# Patient Record
Sex: Female | Born: 1965 | Race: Black or African American | Hispanic: No | Marital: Single | Smoking: Never smoker
Health system: Southern US, Community
[De-identification: ages and names within clinical notes are randomized; demographics above are authoritative.]

## PROBLEM LIST (undated history)

## (undated) DIAGNOSIS — R002 Palpitations: Secondary | ICD-10-CM

## (undated) DIAGNOSIS — G4730 Sleep apnea, unspecified: Secondary | ICD-10-CM

## (undated) DIAGNOSIS — D649 Anemia, unspecified: Secondary | ICD-10-CM

## (undated) DIAGNOSIS — I10 Essential (primary) hypertension: Secondary | ICD-10-CM

## (undated) DIAGNOSIS — C801 Malignant (primary) neoplasm, unspecified: Secondary | ICD-10-CM

## (undated) DIAGNOSIS — Z889 Allergy status to unspecified drugs, medicaments and biological substances status: Secondary | ICD-10-CM

## (undated) DIAGNOSIS — E213 Hyperparathyroidism, unspecified: Secondary | ICD-10-CM

## (undated) HISTORY — PX: TUBAL LIGATION: SHX77

## (undated) HISTORY — PX: OTHER SURGICAL HISTORY: SHX169

## (undated) HISTORY — PX: GYNECOLOGIC CRYOSURGERY: SHX857

## (undated) HISTORY — DX: Essential (primary) hypertension: I10

---

## 2016-01-09 ENCOUNTER — Encounter: Payer: Self-pay | Admitting: Cardiovascular Disease

## 2016-01-09 ENCOUNTER — Ambulatory Visit (INDEPENDENT_AMBULATORY_CARE_PROVIDER_SITE_OTHER): Payer: PRIVATE HEALTH INSURANCE | Admitting: Cardiovascular Disease

## 2016-01-09 VITALS — BP 152/100 | HR 61 | Ht 63.0 in | Wt 285.0 lb

## 2016-01-09 DIAGNOSIS — R002 Palpitations: Secondary | ICD-10-CM | POA: Diagnosis not present

## 2016-01-09 DIAGNOSIS — G4733 Obstructive sleep apnea (adult) (pediatric): Secondary | ICD-10-CM | POA: Diagnosis not present

## 2016-01-09 DIAGNOSIS — I10 Essential (primary) hypertension: Secondary | ICD-10-CM | POA: Diagnosis not present

## 2016-01-09 MED ORDER — LISINOPRIL 5 MG PO TABS: 5.0000 mg | ORAL_TABLET | Freq: Every day | ORAL | Status: DC

## 2016-01-09 NOTE — Patient Instructions (Addendum)
   Begin Lisinopril 5mg  daily - new sent to [facility] today. Continue all other medications.   Follow up in  6 weeks    [address] [place], [place]  [address] [phone]

## 2016-01-09 NOTE — Progress Notes (Signed)
Patient ID: Kim Bryant, female   DOB: [birth year], 51 y.o.   MRN: [id number]       CARDIOLOGY CONSULT NOTE  Patient ID: Kim Bryant MRN: [id number] DOB/AGE: 12-30-1965 50 y.o.  Admit date: (Not on file) Primary Physician: Frederic Jericho, NP Referring Physician: Carlis Abbott NP  Reason for Consultation: palpitations  HPI: The patient is a 50 year old woman with a history of Graves' disease status post iodine 131 ablation and hyperparathyroidism as well as vitamin D deficiency. She has previously been evaluated by a cardiologist with [facility]. She reportedly has a history of a nuclear stress test which was nonischemic and calculated EF 43% (no report available). Echocardiogram performed in 2016 was a technically difficult study but did demonstrate normal left ventricular systolic and diastolic function, EF 0000000. She has sleep apnea. She was reportedly being scheduled for a cardiac MRI in order to more accurately assess LVEF, scar, and to evaluate for ARVD, but insurance would not cover this.  I have personally reviewed all labs and studies pertaining to this consultation. Labs on 08/18/15 showed BUN 11, creatinine 0.68, potassium 4.9, magnesium 2.2, total cholesterol 160, triglycerides 72, HDL 45, LDL 101, with normal hemoglobin of 12.7 on 06/27/15 and TSH 0.5.  She has had problems with her insurance company and has been unable to have her CPAP settings adjusted. She is being scheduled to see an endocrinologist, Dr. Dorris Fetch, in [place]. She was told she may have a thyroid tumor and may require surgery. She said her blood pressure has been elevated over the past several months. She has palpitations when washing the dishes, while sitting down, and particularly when lying down at night after working the night shift. She is a Marine scientist at the [employer]. She has associated dizziness but denies syncope. She denies chest pain and shortness of breath. I do not have a copy of the Holter monitor  nor an ECG. There is no family history of sudden cardiac death.   No Known Allergies  Current Outpatient Prescriptions  Medication Sig Dispense Refill  . ferrous sulfate 325 (65 FE) MG tablet Take 325 mg by mouth daily with breakfast.     . furosemide (LASIX) 20 MG tablet Take 10 mg by mouth daily.     Marland Kitchen levothyroxine (SYNTHROID, LEVOTHROID) 175 MCG tablet Take 175 mcg by mouth daily before breakfast.     . metoprolol succinate (TOPROL-XL) 25 MG 24 hr tablet Take 25 mg by mouth daily.     . Vitamin D, Ergocalciferol, (DRISDOL) 50000 units CAPS capsule Take 50,000 Units by mouth 2 (two) times a week.     No current facility-administered medications for this visit.    Past Medical History  Diagnosis Date  . Hypertension     No past surgical history on file.  Social History   Social History  . Marital Status: Single    Spouse Name: N/A  . Number of Children: N/A  . Years of Education: N/A   Occupational History  . Not on file.   Social History Main Topics  . Smoking status: Never Smoker   . Smokeless tobacco: Never Used  . Alcohol Use: Not on file  . Drug Use: Not on file  . Sexual Activity: Not on file   Other Topics Concern  . Not on file   Social History Narrative  . No narrative on file     No family history of premature CAD in 1st degree relatives.  Prior to Admission medications   Medication  Sig Start Date End Date Taking? Authorizing Provider  ferrous sulfate 325 (65 FE) MG tablet Take 325 mg by mouth daily with breakfast.    Yes Historical Provider, MD  furosemide (LASIX) 20 MG tablet Take 10 mg by mouth daily.    Yes Historical Provider, MD  levothyroxine (SYNTHROID, LEVOTHROID) 175 MCG tablet Take 175 mcg by mouth daily before breakfast.    Yes Historical Provider, MD  metoprolol succinate (TOPROL-XL) 25 MG 24 hr tablet Take 25 mg by mouth daily.    Yes Historical Provider, MD  Vitamin D, Ergocalciferol, (DRISDOL) 50000 units CAPS capsule Take 50,000  Units by mouth 2 (two) times a week.   Yes Historical Provider, MD     Review of systems complete and found to be negative unless listed above in HPI     Physical exam Blood pressure 152/100, pulse 61, height 5\' 3"  (1.6 m), weight 285 lb (129.275 kg), SpO2 96 %. General: NAD Neck: No JVD.  Lungs: Clear to auscultation bilaterally with normal respiratory effort. CV: Nondisplaced PMI. Regular rate and rhythm, normal S1/S2, no S3/S4, no murmur.  No peripheral edema.  No carotid bruit.   Abdomen: Soft, obese. Skin: Intact without lesions or rashes.  Neurologic: Alert and oriented x 3.  Psych: Normal affect. Extremities: No clubbing or cyanosis.  HEENT: Normal.   ECG: Most recent ECG reviewed.  Labs:  No results found for: WBC, HGB, HCT, MCV, PLT No results for input(s): NA, K, CL, CO2, BUN, CREATININE, CALCIUM, PROT, BILITOT, ALKPHOS, ALT, AST, GLUCOSE in the last 168 hours.  Invalid input(s): LABALBU No results found for: CKTOTAL, CKMB, CKMBINDEX, TROPONINI No results found for: CHOL No results found for: HDL No results found for: LDLCALC No results found for: TRIG No results found for: CHOLHDL No results found for: LDLDIRECT       Studies: No results found.  ASSESSMENT AND PLAN:  1. Palpitations: May be driven by underlying thyroid disease. Scheduled to see an endocrinologist. Will obtain copy of ECG, stress test, and Holter monitor. Given resting HR in 60's, unable to increase metoprolol dose. Will aim to control BP as well.  2. Essential HTN: Elevated. I have encouraged her to try taking CPAP to [facility] in [place] to respiratory therapist who specializes in sleep medicine. Will star lisinopril 5 mg daily.  3. Sleep apnea: This is an underlying risk factor for arrhythmias, MI, and CVA. I have encouraged her to try taking CPAP to [facility] in [place] to respiratory therapist who specializes in sleep medicine.  Dispo: fu 6 weeks.   Signed: Kate Sable,  M.D., F.A.C.C.  01/09/2016, 8:43 AM

## 2016-01-17 ENCOUNTER — Ambulatory Visit: Payer: Self-pay | Admitting: Cardiovascular Disease

## 2016-01-23 ENCOUNTER — Encounter: Payer: Self-pay | Admitting: *Deleted

## 2016-01-25 ENCOUNTER — Ambulatory Visit (INDEPENDENT_AMBULATORY_CARE_PROVIDER_SITE_OTHER): Payer: PRIVATE HEALTH INSURANCE | Admitting: "Endocrinology

## 2016-01-25 ENCOUNTER — Encounter: Payer: Self-pay | Admitting: "Endocrinology

## 2016-01-25 VITALS — BP 149/89 | HR 67 | Ht 63.0 in | Wt 281.0 lb

## 2016-01-25 DIAGNOSIS — E213 Hyperparathyroidism, unspecified: Secondary | ICD-10-CM | POA: Diagnosis not present

## 2016-01-25 DIAGNOSIS — E038 Other specified hypothyroidism: Secondary | ICD-10-CM

## 2016-01-25 NOTE — Progress Notes (Signed)
Subjective:    Patient ID: Kim Bryant, female    DOB: 1965/10/12, PCP Frederic Jericho, NP   Past Medical History  Diagnosis Date  . Hypertension    History reviewed. No pertinent past surgical history. Social History   Social History  . Marital Status: Single    Spouse Name: N/A  . Number of Children: N/A  . Years of Education: N/A   Social History Main Topics  . Smoking status: Never Smoker   . Smokeless tobacco: Never Used  . Alcohol Use: None  . Drug Use: None  . Sexual Activity: Not Asked   Other Topics Concern  . None   Social History Narrative   Outpatient Encounter Prescriptions as of 01/25/2016  Medication Sig  . Cholecalciferol (VITAMIN D3) 2000 units TABS Take by mouth daily.  . ferrous sulfate 325 (65 FE) MG tablet Take 325 mg by mouth daily with breakfast.   . furosemide (LASIX) 20 MG tablet Take 10 mg by mouth daily.   Marland Kitchen levothyroxine (SYNTHROID, LEVOTHROID) 175 MCG tablet Take 175 mcg by mouth daily before breakfast.   . lisinopril (PRINIVIL,ZESTRIL) 5 MG tablet Take 1 tablet (5 mg total) by mouth daily.  . metoprolol succinate (TOPROL-XL) 25 MG 24 hr tablet Take 25 mg by mouth daily.   . Vitamin D, Ergocalciferol, (DRISDOL) 50000 units CAPS capsule Take 50,000 Units by mouth 2 (two) times a week.   No facility-administered encounter medications on file as of 01/25/2016.   ALLERGIES: No Known Allergies VACCINATION STATUS:  There is no immunization history on file for this patient.  HPI  50 year old female patient with medical history as above. 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 This consult was requested by Dr. Mady Gemma. She is on levothyroxine 175 g by mouth every morning. She has been following at endocrine care clinic  in [place], [place]. - She was seen by Dr. Vicente Males  in there. She did have last thyroid function test in March,  not available for review today. She reports compliance with her medications.    One other  problem she had was recent discovery of hypercalcemia associated with elevated PTH levels- suspicious for hyperparathyroidism. Per her report, she underwent what appears to be parathyroid scan and was considered for surgical treatment for hyperparathyroidism. However, her insurance did not cover this care in [place], hence referral to me. Her current referral packet does not include any of her workup for hyperparathyroidism. -However, she does not recall if she had 24 hour urine calcium determined. -She denies dysphagia, shortness of breath, nor voice change. - She denies heat/cold intolerance. She has progressive weight gain. -She denies history of nephrolithiasis, osteoporosis, nor seizure disorders. -Her intake of dairy products is that of average. She denies family history of parathyroid, pituitary, and adrenal problem/dysfunction.  Review of Systems Constitutional: weight gain, no fatigue, no subjective hyperthermia/hypothermia Eyes: no blurry vision, no xerophthalmia ENT: no sore throat, no nodules palpated in throat, no dysphagia/odynophagia, no hoarseness Cardiovascular: no CP/SOB/palpitations/leg swelling Respiratory: no cough/SOB Gastrointestinal: no N/V/D/C Musculoskeletal: no muscle/joint aches Skin: no rashes Neurological: no tremors/numbness/tingling/dizziness Psychiatric: no depression/anxiety Objective:    BP 149/89 mmHg  Pulse 67  Ht 5\' 3"  (1.6 m)  Wt 281 lb (127.461 kg)  BMI 49.79 kg/m2  SpO2 97%  Wt Readings from Last 3 Encounters:  01/25/16 281 lb (127.461 kg)  01/09/16 285 lb (129.275 kg)  Physical Exam  Constitutional: overweight, in NAD Eyes: PERRLA, EOMI, no exophthalmos ENT: moist mucous membranes, no thyromegaly, no cervical lymphadenopathy Cardiovascular: RRR, No MRG Respiratory: CTA B Gastrointestinal: abdomen soft, NT, ND, BS+ Musculoskeletal: no  deformities, strength intact in all 4 Skin: moist, warm, no rashes Neurological: no tremor with outstretched hands, DTR normal in all 4   Review of her records shows that in October 2016 her intact PTH was elevated at 109 [normal 15-65 ) associated with elevated calcium of 11.2.  TSH was 0.505 associated with free T4 of 1.05  Assessment & Plan:   1. RAI induced hypothyroidism - She has well settled diagnosis of RAI induced hypothyroidism given for hyperparathyroidism 25 years ago. -I don't have recent labs to review, I advised her to continue levothyroxine 175 g by mouth every morning.  - We discussed about correct intake of levothyroxine, at fasting, with water, separated by at least 30 minutes from breakfast, and separated by more than 4 hours from calcium, iron, multivitamins, acid reflux medications (PPIs). -Patient is made aware of the fact that thyroid hormone replacement is needed for life, dose to be adjusted by periodic monitoring of thyroid function tests. -I will request for a copy of her last workup/thyroid function test from her former endocrine allergy care clinic in [place], [place]. -Will make adjustment in her levothyroxine dose if necessary.  2. Hyperparathyroidism, unspecified ([facility]) - I need to review her completed hyperparathyroidism workup before we decide on surgical care on her. If she is confirmed to have primary hyperparathyroidism ,she is a surgical candidate. - One very important workup left to do is 24-hour urine calcium determination. This is important to rule out earlier differential diagnosis of familial hypocalciuric hypercalcemia which would not  need any treatment. -If her scan findings agree with 24-hour urine calcium levels, she will be referred to a local endocrine surgeon in [place], [place].   - I advised patient to maintain close follow up with Frederic Jericho, NP for primary care needs. Follow up plan: Return in about 2 weeks (around 02/08/2016), or  please obtain records from Dr.  Vicente Males from [place] , [place]., for follow up with pre-visit labs.  Glade Lloyd, MD Phone: [phone]  Fax: [phone]   01/25/2016, 3:59 PM

## 2016-02-08 ENCOUNTER — Ambulatory Visit: Payer: PRIVATE HEALTH INSURANCE | Admitting: "Endocrinology

## 2016-02-13 ENCOUNTER — Ambulatory Visit (INDEPENDENT_AMBULATORY_CARE_PROVIDER_SITE_OTHER): Payer: PRIVATE HEALTH INSURANCE | Admitting: Cardiovascular Disease

## 2016-02-13 ENCOUNTER — Encounter: Payer: Self-pay | Admitting: Cardiovascular Disease

## 2016-02-13 VITALS — BP 114/74 | HR 70 | Ht 63.0 in | Wt 285.0 lb

## 2016-02-13 DIAGNOSIS — R002 Palpitations: Secondary | ICD-10-CM | POA: Diagnosis not present

## 2016-02-13 DIAGNOSIS — I10 Essential (primary) hypertension: Secondary | ICD-10-CM | POA: Diagnosis not present

## 2016-02-13 DIAGNOSIS — G4733 Obstructive sleep apnea (adult) (pediatric): Secondary | ICD-10-CM

## 2016-02-13 NOTE — Progress Notes (Signed)
Patient ID: Kim Bryant, female   DOB: Jul 01, 1966, 50 y.o.   MRN: [id number]      SUBJECTIVE: The patient presents for follow-up of palpitations and hypertension. Since her last visit with me she has not experienced any significant palpitations. She thinks she feels somewhat better with blood pressure control. She denies chest pain. She had an isolated episode of dizziness but denies syncope. She is now using CPAP.   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   Review of Systems: As per "subjective", otherwise negative.  No Known Allergies  Current Outpatient Prescriptions  Medication Sig Dispense Refill  . Cholecalciferol (VITAMIN D3) 2000 units TABS Take 1 tablet by mouth daily.     . ferrous sulfate 325 (65 FE) MG tablet Take 325 mg by mouth daily with breakfast.     . furosemide (LASIX) 20 MG tablet Take 10 mg by mouth daily.     Marland Kitchen levothyroxine (SYNTHROID, LEVOTHROID) 175 MCG tablet Take 175 mcg by mouth daily before breakfast.     . lisinopril (PRINIVIL,ZESTRIL) 5 MG tablet Take 1 tablet (5 mg total) by mouth daily. 30 tablet 6  . metoprolol succinate (TOPROL-XL) 25 MG 24 hr tablet Take 25 mg by mouth daily.     . Vitamin D, Ergocalciferol, (DRISDOL) 50000 units CAPS capsule Take 50,000 Units by mouth 2 (two) times a week.     No current facility-administered medications for this visit.    Past Medical History  Diagnosis Date  . Hypertension     No past surgical history on file.  Social History   Social History  . Marital Status: Single    Spouse Name: N/A  . Number of Children: N/A  . Years of Education: N/A   Occupational History  . Not on file.   Social History Main Topics  . Smoking status: Never Smoker   . Smokeless tobacco: Never Used  . Alcohol Use: Not on file  . Drug Use: Not on file  . Sexual Activity: Not on file   Other Topics Concern  . Not on file   Social History Narrative     Filed Vitals:   02/13/16 0921  BP: 114/74    Pulse: 70  Height: 5\' 3"  (1.6 m)  Weight: 285 lb (129.275 kg)    PHYSICAL EXAM General: NAD Neck: No JVD.  Lungs: Clear to auscultation bilaterally with normal respiratory effort. CV: Nondisplaced PMI. Regular rate and rhythm, normal S1/S2, no S3/S4, no murmur. No peripheral edema. No carotid bruit.  Abdomen: Soft, obese. Skin: Intact without lesions or rashes.  Neurologic: Alert and oriented x 3.  Psych: Normal affect. Extremities: No clubbing or cyanosis.  HEENT: Normal.     ECG: Most recent ECG reviewed.      ASSESSMENT AND PLAN: 1. Palpitations: May be driven by underlying thyroid disease. Now following with an endocrinologist (Dr. Dorris Fetch). Stable on current metoprolol dose, prescribed by PCP.  2. Essential HTN: Now controlled. Now using CPAP. Continue lisinopril 5 mg daily.  3. Sleep apnea: This is an underlying risk factor for arrhythmias, MI, and CVA. Now using CPAP.  Dispo: fu prn.   Kate Sable, M.D., F.A.C.C.

## 2016-02-13 NOTE — Patient Instructions (Signed)
Your physician recommends that you schedule a follow-up appointment AS NEEDED WITH DR. KONESWARAN  Your physician recommends that you continue on your current medications as directed. Please refer to the Current Medication list given to you today.  Thank you for choosing [facility]!!   

## 2016-02-19 ENCOUNTER — Ambulatory Visit: Payer: PRIVATE HEALTH INSURANCE | Admitting: "Endocrinology

## 2016-03-07 ENCOUNTER — Ambulatory Visit (INDEPENDENT_AMBULATORY_CARE_PROVIDER_SITE_OTHER): Payer: PRIVATE HEALTH INSURANCE | Admitting: "Endocrinology

## 2016-03-07 ENCOUNTER — Encounter: Payer: Self-pay | Admitting: "Endocrinology

## 2016-03-07 VITALS — BP 133/74 | HR 82 | Ht 63.0 in | Wt 281.0 lb

## 2016-03-07 DIAGNOSIS — E038 Other specified hypothyroidism: Secondary | ICD-10-CM

## 2016-03-07 DIAGNOSIS — E213 Hyperparathyroidism, unspecified: Secondary | ICD-10-CM

## 2016-03-07 NOTE — Progress Notes (Signed)
Subjective:    Patient ID: Kim Bryant, female    DOB: [birth year], PCP Frederic Jericho, NP   Past Medical History  Diagnosis Date  . Hypertension    History reviewed. No pertinent past surgical history. Social History   Social History  . Marital Status: Single    Spouse Name: N/A  . Number of Children: N/A  . Years of Education: N/A   Social History Main Topics  . Smoking status: Never Smoker   . Smokeless tobacco: Never Used  . Alcohol Use: None  . Drug Use: None  . Sexual Activity: Not Asked   Other Topics Concern  . None   Social History Narrative   Outpatient Encounter Prescriptions as of 03/07/2016  Medication Sig  . Cholecalciferol (VITAMIN D3) 2000 units TABS Take 1 tablet by mouth daily.   . ferrous sulfate 325 (65 FE) MG tablet Take 325 mg by mouth daily with breakfast.   . furosemide (LASIX) 20 MG tablet Take 10 mg by mouth daily.   Marland Kitchen levothyroxine (SYNTHROID, LEVOTHROID) 175 MCG tablet Take 175 mcg by mouth daily before breakfast.   . lisinopril (PRINIVIL,ZESTRIL) 5 MG tablet Take 1 tablet (5 mg total) by mouth daily.  . metoprolol succinate (TOPROL-XL) 25 MG 24 hr tablet Take 25 mg by mouth daily.   . Vitamin D, Ergocalciferol, (DRISDOL) 50000 units CAPS capsule Take 50,000 Units by mouth 2 (two) times a week.   No facility-administered encounter medications on file as of 03/07/2016.   ALLERGIES: No Known Allergies VACCINATION STATUS:  There is no immunization history on file for this patient.  HPI  50 year old female patient with medical history as above. 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 This consult was requested by Dr. Mady Gemma. She is on levothyroxine 175 g by mouth every morning. She has been following at endocrine care clinic  in [place], [place]. - She was seen by Dr. Vicente Males  in there. She did have last thyroid function test in   February 2017 showing free T4 1 0.62 and TSH of 0.2.  She reports compliance with her medications.    One other  problem she had was recent discovery of hypercalcemia associated with elevated PTH levels- consistent with hyperparathyroidism.  she underwent what appears to be parathyroid scan and was considered for surgical treatment for hyperparathyroidism. However, her insurance did not cover this care in [place], hence referral to me.  - I have obtained more workup since her last visit, see below.  -She denies dysphagia, shortness of breath, nor voice change. - She denies heat/cold intolerance. She has progressive weight gain. -She denies history of nephrolithiasis, osteoporosis, nor seizure disorders. -Her intake of dairy products is that of average. She denies family history of parathyroid, pituitary, and adrenal problem/dysfunction.  Review of Systems Constitutional: weight gain, no fatigue, no subjective hyperthermia/hypothermia Eyes: no blurry vision, no xerophthalmia ENT: no sore throat, no nodules palpated in throat, no dysphagia/odynophagia, no hoarseness Cardiovascular: no CP/SOB/palpitations/leg swelling Respiratory: no cough/SOB Gastrointestinal: no N/V/D/C Musculoskeletal: no muscle/joint aches Skin: no rashes Neurological: no tremors/numbness/tingling/dizziness Psychiatric: no depression/anxiety Objective:    BP 133/74 mmHg  Pulse 82  Ht 5\' 3"  (1.6 m)  Wt 281 lb (127.461 kg)  BMI 49.79 kg/m2  Wt Readings from Last 3 Encounters:  03/07/16 281 lb (127.461 kg)  02/13/16 285 lb (129.275 kg)  01/25/16 281 lb (127.461 kg)  Physical Exam  Constitutional: overweight, in NAD Eyes: PERRLA, EOMI, no exophthalmos ENT: moist mucous membranes, no thyromegaly, no cervical lymphadenopathy Cardiovascular: RRR, No MRG Respiratory: CTA B Gastrointestinal: abdomen soft, NT, ND, BS+ Musculoskeletal: no deformities, strength intact in all 4 Skin: moist, warm, no  rashes Neurological: no tremor with outstretched hands, DTR normal in all 4   Review of her records shows that in October 2016 her intact PTH was elevated at 109 [normal 15-65 ) associated with elevated calcium of 11.2.  TSH was 0.505 associated with free T4 of 1.05  We have obtain more records on her from her prior endocrinologist Dr. Vicente Males office: On 10/11/2015 her PTH was elevated at 149 associated with high calcium of 11.1, I advised calcium of 6.0 elevated 24-hour urine calcium on 02/05/2016 was 320 elevated (normal 100-300) Her bone density scan on 10/11/2015 was consistent with osteopenia on spine  Assessment & Plan:    1. Hyperparathyroidism, Primary  - I have requested and reviewed her prior workup for parathyroid function. As detailed above, these workup indicates primary hyperparathyroidism.  -  I have discussed potential competitions of untreated hyperparathyroidism with her and gave her options. She agrees with definitive treatment with parathyroidectomy. - I will refer her to Dr. Armandina Gemma in [place], [place] for same.  2. RAI induced hypothyroidism - She has well settled diagnosis of RAI induced hypothyroidism given for hyperparathyroidism 25 years ago. -Her most recent thyroid function tests are consistent with appropriate replacement. - I advised her to continue levothyroxine 175 g by mouth every morning.  - We discussed about correct intake of levothyroxine, at fasting, with water, separated by at least 30 minutes from breakfast, and separated by more than 4 hours from calcium, iron, multivitamins, acid reflux medications (PPIs). -Patient is made aware of the fact that thyroid hormone replacement is needed for life, dose to be adjusted by periodic monitoring of thyroid function tests.   - I advised patient to maintain close follow up with Frederic Jericho, NP for primary care needs. Follow up plan: Return in about 6 weeks (around 04/18/2016) for follow up with  pre-visit labs.  Glade Lloyd, MD Phone: [phone]  Fax: [phone]   03/07/2016, 2:01 PM

## 2016-03-20 ENCOUNTER — Encounter: Payer: Self-pay | Admitting: "Endocrinology

## 2016-03-28 ENCOUNTER — Ambulatory Visit: Payer: Self-pay | Admitting: General Surgery

## 2016-03-28 NOTE — H&P (Signed)
History of Present Illness The patient is a 50 year old female who presents with a complaint of primary hyperparathyroidism. Patient is a 50 year old female who is referred by Dr. Dorris Fetch for evaluation of primary hyperparathyroidism. Patient appeared to have a fairly lengthy workup in [place]. Patient was referred for surgical management however was denied by her insurance. Patient set multiple calcium levels in the 11 range. Patient's abdomen elevated PTH of 149. Patient does state that she has some mood swings. She has had no issues with kidney stones. Patient does noticed that she's had some palpitations. She was worked up by cardiology however this was normal and felt to be secondary to her hypercalcemia.  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 She is currently on Synthroid. She been doing well from that standpoint.   Allergies No Known Drug Allergies 03/28/2016  Medication History Lisinopril (5MG  Tablet, Oral) Active. Metoprolol Tartrate (25MG  Tablet, Oral) Active. Ferrous Sulfate (27MG  Tablet, Oral) Active. Levothyroxine Sodium (175MCG Tablet, Oral) Active. Vitamin D (Cholecalciferol) (1000UNIT Capsule, Oral) Active. Furosemide (20MG  Tablet, Oral) Active. Medications Reconciled    Vitals 03/28/2016 10:26 AM Weight: 275 lb Height: 63in Body Surface Area: 2.21 m Body Mass Index: 48.71 kg/m  Pulse: 81 (Regular)  BP: 128/80 (Sitting, Left Arm, Standard)      Physical Exam  General Mental Status-Alert. General Appearance-Consistent with stated age. Hydration-Well hydrated. Voice-Normal.  Head and Neck Note: No palpable mass/nodule  Chest and Lung Exam Chest and lung exam reveals -quiet, even and easy respiratory effort with no use of accessory muscles and on auscultation, normal breath sounds, no adventitious sounds and normal vocal resonance. Inspection Chest Wall - Normal. Back -  normal.  Cardiovascular Cardiovascular examination reveals -normal heart sounds, regular rate and rhythm with no murmurs and normal pedal pulses bilaterally.  Abdomen Inspection Inspection of the abdomen reveals - No Hernias. Skin - Scar - no surgical scars. Palpation/Percussion Palpation and Percussion of the abdomen reveal - Soft, Non Tender, No Rebound tenderness, No Rigidity (guarding) and No hepatosplenomegaly. Auscultation Auscultation of the abdomen reveals - Bowel sounds normal.    Assessment & Plan  PRIMARY HYPERPARATHYROIDISM (E21.0) Impression: 50 year old female with primary hyperparathyroidism.  1. Will attempt to obtain the ultrasound she had an [place]. 2. Patient will require sestamibi scan to help localize the parathyroid adenoma. 3. I discussed with the patient that once these have been resultant we will proceed with planning surgery. At this time the plan is to proceed with minimally invasive parathyroidectomy. 4. Discussed the patient the risks and benefits of the procedure to include but not limited to: Infection, bleeding, damage to structures, possible need for further surgery, and the patient was understanding and wished to proceed.

## 2016-04-02 ENCOUNTER — Other Ambulatory Visit (HOSPITAL_COMMUNITY): Payer: Self-pay | Admitting: General Surgery

## 2016-04-02 DIAGNOSIS — E210 Primary hyperparathyroidism: Secondary | ICD-10-CM

## 2016-04-09 ENCOUNTER — Encounter (HOSPITAL_COMMUNITY)
Admission: RE | Admit: 2016-04-09 | Discharge: 2016-04-09 | Disposition: A | Discharge status: Home / Self Care | Payer: PRIVATE HEALTH INSURANCE | Source: Ambulatory Visit | Attending: General Surgery | Admitting: General Surgery

## 2016-04-09 ENCOUNTER — Encounter (HOSPITAL_COMMUNITY): Payer: Self-pay

## 2016-04-09 DIAGNOSIS — E210 Primary hyperparathyroidism: Secondary | ICD-10-CM | POA: Diagnosis present

## 2016-04-09 MED ORDER — TECHNETIUM TC 99M SESTAMIBI GENERIC - CARDIOLITE
25.0000 | Freq: Once | INTRAVENOUS | Status: AC | PRN
  Administered 2016-04-09: 26 via INTRAVENOUS

## 2016-04-19 ENCOUNTER — Ambulatory Visit: Payer: PRIVATE HEALTH INSURANCE | Admitting: "Endocrinology

## 2016-04-25 ENCOUNTER — Encounter (HOSPITAL_COMMUNITY): Payer: Self-pay

## 2016-04-25 ENCOUNTER — Encounter (HOSPITAL_COMMUNITY)
Admission: RE | Admit: 2016-04-25 | Discharge: 2016-04-25 | Disposition: A | Discharge status: Home / Self Care | Payer: PRIVATE HEALTH INSURANCE | Source: Ambulatory Visit | Attending: General Surgery | Admitting: General Surgery

## 2016-04-25 DIAGNOSIS — Z79899 Other long term (current) drug therapy: Secondary | ICD-10-CM | POA: Diagnosis not present

## 2016-04-25 DIAGNOSIS — E210 Primary hyperparathyroidism: Secondary | ICD-10-CM | POA: Insufficient documentation

## 2016-04-25 DIAGNOSIS — Z01812 Encounter for preprocedural laboratory examination: Secondary | ICD-10-CM | POA: Diagnosis not present

## 2016-04-25 HISTORY — DX: Hyperparathyroidism, unspecified: E21.3

## 2016-04-25 HISTORY — DX: Palpitations: R00.2

## 2016-04-25 HISTORY — DX: Malignant (primary) neoplasm, unspecified: C80.1

## 2016-04-25 HISTORY — DX: Allergy status to unspecified drugs, medicaments and biological substances: Z88.9

## 2016-04-25 HISTORY — DX: Sleep apnea, unspecified: G47.30

## 2016-04-25 HISTORY — DX: Anemia, unspecified: D64.9

## 2016-04-25 LAB — BASIC METABOLIC PANEL
ANION GAP: 7 (ref 5–15)
BUN: 11 mg/dL (ref 6–20)
CALCIUM: 10.6 mg/dL — AB (ref 8.9–10.3)
CO2: 25 mmol/L (ref 22–32)
Chloride: 109 mmol/L (ref 101–111)
Creatinine, Ser: 0.47 mg/dL (ref 0.44–1.00)
Glucose, Bld: 88 mg/dL (ref 65–99)
Potassium: 4.3 mmol/L (ref 3.5–5.1)
Sodium: 141 mmol/L (ref 135–145)

## 2016-04-25 LAB — HCG, SERUM, QUALITATIVE: PREG SERUM: NEGATIVE

## 2016-04-25 LAB — CBC
HCT: 42 % (ref 36.0–46.0)
Hemoglobin: 13.4 g/dL (ref 12.0–15.0)
MCH: 27 pg (ref 26.0–34.0)
MCHC: 31.9 g/dL (ref 30.0–36.0)
MCV: 84.5 fL (ref 78.0–100.0)
PLATELETS: 250 10*3/uL (ref 150–400)
RBC: 4.97 MIL/uL (ref 3.87–5.11)
RDW: 13.4 % (ref 11.5–15.5)
WBC: 6.7 10*3/uL (ref 4.0–10.5)

## 2016-04-25 NOTE — Pre-Procedure Instructions (Signed)
EKG 10'16 Epic. 

## 2016-04-25 NOTE — Pre-Procedure Instructions (Signed)
Echo 12'16 report with chart.

## 2016-04-25 NOTE — Patient Instructions (Addendum)
DEKOTA ZWAHLEN  04/25/2016   Your procedure is scheduled on: 05-03-16   Report to [facility]  Entrance take [facility]  elevators to 3rd floor to  [place] at 1230 PM.  Call this number if you have problems the morning of surgery [phone]   Remember: ONLY 1 PERSON MAY GO WITH YOU TO SHORT STAY TO GET  READY MORNING OF [place].  Do not eat food or drink liquids :After Midnight.Exception -may have Clear liquids 12 midnight to 0830 AM, then nothing.   CLEAR LIQUID DIET   Foods Allowed                                                                     Foods Excluded  Coffee and tea, regular and decaf                             liquids that you cannot  Plain Jell-O in any flavor                                             see through such as: Fruit ices (not with fruit pulp)                                     milk, soups, orange juice  Iced Popsicles                                    All solid food Carbonated beverages, regular and diet                                    Cranberry, grape and apple juices Sports drinks like Gatorade Lightly seasoned clear broth or consume(fat free) Sugar, honey syrup   _____________________________________________________________________       Take these medicines the morning of surgery with A SIP OF WATER: Levothyroxine. Metoprolol. DO NOT TAKE ANY DIABETIC MEDICATIONS DAY OF YOUR SURGERY                               You may not have any metal on your body including hair pins and              piercings  Do not wear jewelry, make-up, lotions, powders or perfumes, deodorant             Do not wear nail polish.  Do not shave  48 hours prior to surgery.              Men may shave face and neck.   Do not bring valuables to the hospital. [place]  FOR VALUABLES.  Contacts, dentures or bridgework may not be worn into surgery.  Leave suitcase in the car. After surgery it  may be brought to your room.     Patients discharged the day of surgery will not be allowed to drive home.  Name and phone number of your driver: [place] Alt- significant other-[phone] cell  Special Instructions: N/A              Please read over the following fact sheets you were given: _____________________________________________________________________             [facility] - Preparing for Surgery Before surgery, you can play an important role.  Because skin is not sterile, your skin needs to be as free of germs as possible.  You can reduce the number of germs on your skin by washing with CHG (chlorahexidine gluconate) soap before surgery.  CHG is an antiseptic cleaner which kills germs and bonds with the skin to continue killing germs even after washing. Please DO NOT use if you have an allergy to CHG or antibacterial soaps.  If your skin becomes reddened/irritated stop using the CHG and inform your nurse when you arrive at Short Stay. Do not shave (including legs and underarms) for at least 48 hours prior to the first CHG shower.  You may shave your face/neck. Please follow these instructions carefully:  1.  Shower with CHG Soap the night before surgery and the  morning of Surgery.  2.  If you choose to wash your hair, wash your hair first as usual with your  normal  shampoo.  3.  After you shampoo, rinse your hair and body thoroughly to remove the  shampoo.                           4.  Use CHG as you would any other liquid soap.  You can apply chg directly  to the skin and wash                       Gently with a scrungie or clean washcloth.  5.  Apply the CHG Soap to your body ONLY FROM THE NECK DOWN.   Do not use on face/ open                           Wound or open sores. Avoid contact with eyes, ears mouth and genitals (private parts).                       Wash face,  Genitals (private parts) with your normal soap.             6.  Wash thoroughly, paying special  attention to the area where your surgery  will be performed.  7.  Thoroughly rinse your body with warm water from the neck down.  8.  DO NOT shower/wash with your normal soap after using and rinsing off  the CHG Soap.                9.  Pat yourself dry with a clean towel.            10.  Wear clean pajamas.            11.  Place clean sheets on your bed the night of your first shower and do not  sleep with pets. Day of  Surgery : Do not apply any lotions/deodorants the morning of surgery.  Please wear clean clothes to the hospital/surgery center.  FAILURE TO FOLLOW THESE INSTRUCTIONS MAY RESULT IN THE CANCELLATION OF YOUR SURGERY PATIENT SIGNATURE_________________________________  NURSE SIGNATURE__________________________________  ________________________________________________________________________

## 2016-05-02 MED ORDER — DEXTROSE 5 % IV SOLN
3.0000 g | INTRAVENOUS | Status: AC
  Administered 2016-05-03: 3 g via INTRAVENOUS
  Filled 2016-05-02: qty 3

## 2016-05-03 ENCOUNTER — Encounter (HOSPITAL_COMMUNITY): Payer: Self-pay | Admitting: *Deleted

## 2016-05-03 ENCOUNTER — Ambulatory Visit (HOSPITAL_COMMUNITY): Payer: PRIVATE HEALTH INSURANCE | Admitting: Anesthesiology

## 2016-05-03 ENCOUNTER — Encounter (HOSPITAL_COMMUNITY)
Admission: RE | Disposition: A | Discharge status: Home / Self Care | Payer: Self-pay | Source: Ambulatory Visit | Attending: General Surgery

## 2016-05-03 ENCOUNTER — Observation Stay (HOSPITAL_COMMUNITY)
Admission: RE | Admit: 2016-05-03 | Discharge: 2016-05-04 | Disposition: A | Discharge status: Home / Self Care | Payer: PRIVATE HEALTH INSURANCE | Source: Ambulatory Visit | Attending: General Surgery | Admitting: General Surgery

## 2016-05-03 DIAGNOSIS — Z6841 Body Mass Index (BMI) 40.0 and over, adult: Secondary | ICD-10-CM | POA: Insufficient documentation

## 2016-05-03 DIAGNOSIS — E210 Primary hyperparathyroidism: Principal | ICD-10-CM | POA: Insufficient documentation

## 2016-05-03 DIAGNOSIS — G4730 Sleep apnea, unspecified: Secondary | ICD-10-CM | POA: Diagnosis not present

## 2016-05-03 DIAGNOSIS — Z79899 Other long term (current) drug therapy: Secondary | ICD-10-CM | POA: Diagnosis not present

## 2016-05-03 DIAGNOSIS — Z9889 Other specified postprocedural states: Secondary | ICD-10-CM

## 2016-05-03 DIAGNOSIS — E892 Postprocedural hypoparathyroidism: Secondary | ICD-10-CM

## 2016-05-03 DIAGNOSIS — I10 Essential (primary) hypertension: Secondary | ICD-10-CM | POA: Diagnosis not present

## 2016-05-03 DIAGNOSIS — E039 Hypothyroidism, unspecified: Secondary | ICD-10-CM | POA: Diagnosis not present

## 2016-05-03 HISTORY — PX: PARATHYROIDECTOMY: SHX19

## 2016-05-03 LAB — BASIC METABOLIC PANEL
Anion gap: 3 — ABNORMAL LOW (ref 5–15)
BUN: 8 mg/dL (ref 6–20)
CHLORIDE: 107 mmol/L (ref 101–111)
CO2: 28 mmol/L (ref 22–32)
CREATININE: 0.84 mg/dL (ref 0.44–1.00)
Calcium: 10.1 mg/dL (ref 8.9–10.3)
GFR calc Af Amer: >60 mL/min (ref >60)
GFR calc non Af Amer: >60 mL/min (ref >60)
Glucose, Bld: 138 mg/dL — ABNORMAL HIGH (ref 65–99)
POTASSIUM: 4.5 mmol/L (ref 3.5–5.1)
SODIUM: 138 mmol/L (ref 135–145)

## 2016-05-03 SURGERY — PARATHYROIDECTOMY
Anesthesia: General | Laterality: Right

## 2016-05-03 MED ORDER — CALCIUM CARBONATE-VITAMIN D 500-200 MG-UNIT PO TABS
2.0000 | ORAL_TABLET | Freq: Three times a day (TID) | ORAL | Status: DC
  Administered 2016-05-03 – 2016-05-04 (×2): 2 via ORAL
  Filled 2016-05-03: qty 1
  Filled 2016-05-03: qty 2

## 2016-05-03 MED ORDER — MIDAZOLAM HCL 2 MG/2ML IJ SOLN
INTRAMUSCULAR | Status: AC
  Filled 2016-05-03: qty 2

## 2016-05-03 MED ORDER — ONDANSETRON HCL 4 MG/2ML IJ SOLN
INTRAMUSCULAR | Status: AC
  Filled 2016-05-03: qty 2

## 2016-05-03 MED ORDER — OXYCODONE HCL 5 MG PO TABS
5.0000 mg | ORAL_TABLET | ORAL | Status: DC | PRN
  Administered 2016-05-03 (×2): 10 mg via ORAL
  Filled 2016-05-03 (×3): qty 2

## 2016-05-03 MED ORDER — CEFAZOLIN SODIUM-DEXTROSE 2-4 GM/100ML-% IV SOLN
INTRAVENOUS | Status: AC
  Filled 2016-05-03: qty 100

## 2016-05-03 MED ORDER — METOPROLOL SUCCINATE ER 25 MG PO TB24
25.0000 mg | ORAL_TABLET | Freq: Every day | ORAL | Status: DC
  Administered 2016-05-04: 25 mg via ORAL
  Filled 2016-05-03: qty 1

## 2016-05-03 MED ORDER — DEXAMETHASONE SODIUM PHOSPHATE 10 MG/ML IJ SOLN
INTRAMUSCULAR | Status: AC
  Filled 2016-05-03: qty 1

## 2016-05-03 MED ORDER — DEXAMETHASONE SODIUM PHOSPHATE 10 MG/ML IJ SOLN
INTRAMUSCULAR | Status: DC | PRN
  Administered 2016-05-03: 10 mg via INTRAVENOUS

## 2016-05-03 MED ORDER — LIDOCAINE HCL (CARDIAC) 20 MG/ML IV SOLN
INTRAVENOUS | Status: AC
Start: 2016-05-03 — End: 2016-05-03
  Filled 2016-05-03: qty 5

## 2016-05-03 MED ORDER — LEVOTHYROXINE SODIUM 75 MCG PO TABS
175.0000 ug | ORAL_TABLET | Freq: Every day | ORAL | Status: DC
  Administered 2016-05-04: 175 ug via ORAL
  Filled 2016-05-03: qty 1

## 2016-05-03 MED ORDER — SUGAMMADEX SODIUM 200 MG/2ML IV SOLN
INTRAVENOUS | Status: DC | PRN
  Administered 2016-05-03: 200 mg via INTRAVENOUS

## 2016-05-03 MED ORDER — MAGNESIUM OXIDE 400 (241.3 MG) MG PO TABS
400.0000 mg | ORAL_TABLET | Freq: Two times a day (BID) | ORAL | Status: DC
  Administered 2016-05-03 – 2016-05-04 (×2): 400 mg via ORAL
  Filled 2016-05-03 (×2): qty 1

## 2016-05-03 MED ORDER — ONDANSETRON HCL 4 MG/2ML IJ SOLN: 4.0000 mg | Freq: Four times a day (QID) | INTRAMUSCULAR | Status: DC | PRN

## 2016-05-03 MED ORDER — MAGNESIUM OXIDE -MG SUPPLEMENT 200 MG PO TABS: 200.0000 mg | ORAL_TABLET | Freq: Every day | ORAL | 0 refills | Status: AC

## 2016-05-03 MED ORDER — ROCURONIUM BROMIDE 100 MG/10ML IV SOLN
INTRAVENOUS | Status: DC | PRN
  Administered 2016-05-03: 50 mg via INTRAVENOUS
  Administered 2016-05-03: 10 mg via INTRAVENOUS

## 2016-05-03 MED ORDER — FENTANYL CITRATE (PF) 100 MCG/2ML IJ SOLN
25.0000 ug | INTRAMUSCULAR | Status: DC | PRN
  Administered 2016-05-03: 50 ug via INTRAVENOUS

## 2016-05-03 MED ORDER — FUROSEMIDE 20 MG PO TABS
10.0000 mg | ORAL_TABLET | Freq: Every day | ORAL | Status: DC
  Administered 2016-05-04: 10 mg via ORAL
  Filled 2016-05-03: qty 1

## 2016-05-03 MED ORDER — PROPOFOL 10 MG/ML IV BOLUS
INTRAVENOUS | Status: AC
  Filled 2016-05-03: qty 20

## 2016-05-03 MED ORDER — PHENOL 1.4 % MT LIQD
1.0000 | OROMUCOSAL | Status: DC | PRN
  Filled 2016-05-03: qty 177

## 2016-05-03 MED ORDER — ONDANSETRON HCL 4 MG/2ML IJ SOLN: 4.0000 mg | Freq: Once | INTRAMUSCULAR | Status: DC | PRN

## 2016-05-03 MED ORDER — FENTANYL CITRATE (PF) 100 MCG/2ML IJ SOLN
INTRAMUSCULAR | Status: DC | PRN
  Administered 2016-05-03: 100 ug via INTRAVENOUS

## 2016-05-03 MED ORDER — ROCURONIUM BROMIDE 100 MG/10ML IV SOLN
INTRAVENOUS | Status: AC
  Filled 2016-05-03: qty 1

## 2016-05-03 MED ORDER — LISINOPRIL 5 MG PO TABS
5.0000 mg | ORAL_TABLET | Freq: Every day | ORAL | Status: DC
  Administered 2016-05-03: 5 mg via ORAL
  Filled 2016-05-03 (×2): qty 1

## 2016-05-03 MED ORDER — LACTATED RINGERS IV SOLN
INTRAVENOUS | Status: DC
  Administered 2016-05-03: 13:00:00 via INTRAVENOUS

## 2016-05-03 MED ORDER — CHLORHEXIDINE GLUCONATE CLOTH 2 % EX PADS: 6.0000 | MEDICATED_PAD | Freq: Once | CUTANEOUS | Status: DC

## 2016-05-03 MED ORDER — HYDROMORPHONE HCL 1 MG/ML IJ SOLN: 1.0000 mg | INTRAMUSCULAR | Status: DC | PRN

## 2016-05-03 MED ORDER — FENTANYL CITRATE (PF) 100 MCG/2ML IJ SOLN
INTRAMUSCULAR | Status: AC
  Filled 2016-05-03: qty 4

## 2016-05-03 MED ORDER — FENTANYL CITRATE (PF) 100 MCG/2ML IJ SOLN
INTRAMUSCULAR | Status: AC
  Administered 2016-05-03: 50 ug via INTRAVENOUS
  Filled 2016-05-03: qty 2

## 2016-05-03 MED ORDER — DEXTROSE-NACL 5-0.9 % IV SOLN
INTRAVENOUS | Status: DC
  Administered 2016-05-03: 22:00:00 via INTRAVENOUS
  Administered 2016-05-04: 100 mL/h via INTRAVENOUS

## 2016-05-03 MED ORDER — OXYCODONE-ACETAMINOPHEN 5-325 MG PO TABS: 1.0000 | ORAL_TABLET | ORAL | 0 refills | Status: AC | PRN

## 2016-05-03 MED ORDER — PROPOFOL 10 MG/ML IV BOLUS
INTRAVENOUS | Status: DC | PRN
  Administered 2016-05-03: 150 mg via INTRAVENOUS
  Administered 2016-05-03: 50 mg via INTRAVENOUS

## 2016-05-03 MED ORDER — ONDANSETRON 4 MG PO TBDP: 4.0000 mg | ORAL_TABLET | Freq: Four times a day (QID) | ORAL | Status: DC | PRN

## 2016-05-03 MED ORDER — CALCIUM CARBONATE-VITAMIN D 500-200 MG-UNIT PO TABS: 2.0000 | ORAL_TABLET | Freq: Three times a day (TID) | ORAL | 1 refills | Status: AC

## 2016-05-03 MED ORDER — ONDANSETRON HCL 4 MG/2ML IJ SOLN
INTRAMUSCULAR | Status: DC | PRN
  Administered 2016-05-03: 4 mg via INTRAVENOUS

## 2016-05-03 MED ORDER — MIDAZOLAM HCL 5 MG/5ML IJ SOLN
INTRAMUSCULAR | Status: DC | PRN
  Administered 2016-05-03: 2 mg via INTRAVENOUS

## 2016-05-03 MED ORDER — LIDOCAINE HCL (PF) 2 % IJ SOLN
INTRAMUSCULAR | Status: DC | PRN
  Administered 2016-05-03: 30 mg via INTRADERMAL

## 2016-05-03 MED ORDER — BUPIVACAINE HCL (PF) 0.25 % IJ SOLN
INTRAMUSCULAR | Status: AC
  Filled 2016-05-03: qty 30

## 2016-05-03 MED ORDER — SUGAMMADEX SODIUM 200 MG/2ML IV SOLN
INTRAVENOUS | Status: AC
  Filled 2016-05-03: qty 2

## 2016-05-03 MED ORDER — BUPIVACAINE HCL 0.25 % IJ SOLN
INTRAMUSCULAR | Status: DC | PRN
  Administered 2016-05-03: 10 mL

## 2016-05-03 SURGICAL SUPPLY — 42 items
ATTRACTOMAT 16X20 MAGNETIC DRP (DRAPES) ×3 IMPLANT
BENZOIN TINCTURE PRP APPL 2/3 (GAUZE/BANDAGES/DRESSINGS) ×3 IMPLANT
BLADE HEX COATED 2.75 (ELECTRODE) ×3 IMPLANT
BLADE SURG 15 STRL LF DISP TIS (BLADE) ×1 IMPLANT
BLADE SURG 15 STRL SS (BLADE) ×2
CHLORAPREP W/TINT 26ML (MISCELLANEOUS) ×3 IMPLANT
CLIP TI MEDIUM 6 (CLIP) ×3 IMPLANT
CLIP TI WIDE RED SMALL 6 (CLIP) ×3 IMPLANT
CLOSURE WOUND 1/2 X4 (GAUZE/BANDAGES/DRESSINGS) ×1
COVER SURGICAL LIGHT HANDLE (MISCELLANEOUS) ×3 IMPLANT
DERMABOND ADVANCED (GAUZE/BANDAGES/DRESSINGS) ×2
DERMABOND ADVANCED .7 DNX12 (GAUZE/BANDAGES/DRESSINGS) ×1 IMPLANT
DISSECTOR ROUND CHERRY 3/8 STR (MISCELLANEOUS) ×3 IMPLANT
DRAPE LAPAROTOMY T 98X78 PEDS (DRAPES) ×3 IMPLANT
DRESSING SURGICEL FIBRLLR 1X2 (HEMOSTASIS) ×1 IMPLANT
DRSG SURGICEL FIBRILLAR 1X2 (HEMOSTASIS) ×3
ELECT PENCIL ROCKER SW 15FT (MISCELLANEOUS) ×3 IMPLANT
ELECT REM PT RETURN 9FT ADLT (ELECTROSURGICAL) ×3
ELECTRODE REM PT RTRN 9FT ADLT (ELECTROSURGICAL) ×1 IMPLANT
GAUZE SPONGE 4X4 12PLY STRL (GAUZE/BANDAGES/DRESSINGS) ×3 IMPLANT
GAUZE SPONGE 4X4 16PLY XRAY LF (GAUZE/BANDAGES/DRESSINGS) ×3 IMPLANT
GLOVE INDICATOR 8.0 STRL GRN (GLOVE) ×3 IMPLANT
GOWN STRL REUS W/TWL XL LVL3 (GOWN DISPOSABLE) ×9 IMPLANT
KIT BASIN OR (CUSTOM PROCEDURE TRAY) ×3 IMPLANT
NEEDLE HYPO 25X1 1.5 SAFETY (NEEDLE) ×3 IMPLANT
NS IRRIG 1000ML POUR BTL (IV SOLUTION) ×3 IMPLANT
PACK BASIC VI WITH GOWN DISP (CUSTOM PROCEDURE TRAY) ×3 IMPLANT
STAPLER VISISTAT 35W (STAPLE) IMPLANT
STRIP CLOSURE SKIN 1/2X4 (GAUZE/BANDAGES/DRESSINGS) ×2 IMPLANT
SUT MNCRL AB 4-0 PS2 18 (SUTURE) ×3 IMPLANT
SUT SILK 2 0 (SUTURE)
SUT SILK 2-0 18XBRD TIE 12 (SUTURE) IMPLANT
SUT SILK 3 0 (SUTURE)
SUT SILK 3 0 SH CR/8 (SUTURE) ×3 IMPLANT
SUT SILK 3-0 18XBRD TIE 12 (SUTURE) IMPLANT
SUT VIC AB 3-0 SH 18 (SUTURE) ×3 IMPLANT
SYR 20CC LL (SYRINGE) ×3 IMPLANT
SYR BULB IRRIGATION 50ML (SYRINGE) ×3 IMPLANT
TAPE CLOTH SURG 6X10 WHT LF (GAUZE/BANDAGES/DRESSINGS) ×3 IMPLANT
TOWEL OR 17X26 10 PK STRL BLUE (TOWEL DISPOSABLE) ×3 IMPLANT
TOWEL OR NON WOVEN STRL DISP B (DISPOSABLE) IMPLANT
YANKAUER SUCT BULB TIP 10FT TU (MISCELLANEOUS) ×3 IMPLANT

## 2016-05-03 NOTE — Op Note (Signed)
05/03/2016  2:39 PM  PATIENT:  Kim Bryant  50 y.o. female  PRE-OPERATIVE DIAGNOSIS:  PRIMARY HYPERPARATHYROIDISM  POST-OPERATIVE DIAGNOSIS:  PRIMARY HYPERPARATHYROIDISM  PROCEDURE:  Procedure(s): PARATHYROIDECTOMY RIGHT INFERIOR MINIMALLY INVASIVE (Right)  SURGEON:  Surgeon(s) and Role:    * Ralene Ok, MD - Primary    * Jackolyn Confer, MD - Assisting  ANESTHESIA:   local and general  EBL:  Total I/O In: -  Out: 17 [Blood:10]  BLOOD ADMINISTERED:none  DRAINS: none   LOCAL MEDICATIONS USED:  BUPIVICAINE   SPECIMEN:  Source of Specimen:  RIGHT INFERIOR PARATHYROID  DISPOSITION OF SPECIMEN:  PATHOLOGY  COUNTS:  YES  TOURNIQUET:  * No tourniquets in log *  DICTATION: .Dragon Dictation Details of the procedure:  The patient was taken back to the operating room. The patient was placed in supine position with bilateral SCDs in place.  The patient was prepped and draped in the usual sterile fashion. After appropriate anitbiotics were confirmed, a time-out was confirmed and all facts were verified. A right-sided 3 cm incision was made approximately 2 fingerbreadths above the sternal notch. Bovie cautery was used to maintain hemostasis dissection was carried down through the platysma. The platysma was elevated and flaps were created superiorly and inferiorly to the thyroid cartilage as well as the sternal notch, repsectively. The strap muscles were identified in the midline and separated. right-sided strap muscles were elevated off the anterior surface of the thyroid. This dissection was carried laterally. We proceeded to dissect away the right thyroid lobe with Kitners from the surrounding musculature from the thyroid.   Upon dissecting the right inferior lobe of the thyroif, an enlarge parathyroid gland was palpated and identified.  This was dissected out from the surrounding tissue.  Once it was removed it was sent to Pathology for a frozen section confirmation, and  confirmed as parathyroid tissue.  The area was irrigated out. The dissection bed was hemostatic. We placed fibrillar hemostatic agent into the wound. Strap muscles were then reapproximated in the midline with interrupted 3-0 Vicryl stitches. The platysma was reapproximated using 3-0 Vicryl stitches in interrupted fashion. Skin was then reapproximated using a running subcuticular 4-0 Monocryl. The skin was then dressed with Dermabond.   The patient was taken to the recovery room in stable condition.    PLAN OF CARE: Admit for overnight observation  PATIENT DISPOSITION:  PACU - hemodynamically stable.   Delay start of Pharmacological VTE agent (>24hrs) due to surgical blood loss or risk of bleeding: not applicable

## 2016-05-03 NOTE — Transfer of Care (Signed)
Immediate Anesthesia Transfer of Care Note  Patient: Kim Bryant  Procedure(s) Performed: Procedure(s): PARATHYROIDECTOMY RIGHT INFERIOR MINIMALLY INVASIVE (Right)  Patient Location: PACU  Anesthesia Type:General  Level of Consciousness:  sedated, patient cooperative and responds to stimulation  Airway & Oxygen Therapy:Patient Spontanous Breathing and Patient connected to face mask oxgen  Post-op Assessment:  Report given to PACU RN and Post -op Vital signs reviewed and stable  Post vital signs:  Reviewed and stable  Last Vitals:  Vitals:   05/03/16 1241 05/03/16 1449  BP: 140/80 (P) 126/87  Pulse: 65 73  Resp: 16 16  Temp: 36.7 C (P) [id number] C    Complications: No apparent anesthesia complicationsImmediate Anesthesia Transfer of Care Note  Patient: Kim Bryant  Procedure(s) Performed: Procedure(s): PARATHYROIDECTOMY RIGHT INFERIOR MINIMALLY INVASIVE (Right)  Patient Location: PACU  Anesthesia Type:General  Level of Consciousness: awake, alert , oriented and patient cooperative  Airway & Oxygen Therapy: Patient Spontanous Breathing and Patient connected to face mask oxygen  Post-op Assessment: Report given to RN, Post -op Vital signs reviewed and stable and Patient moving all extremities  Post vital signs: Reviewed and stable  Last Vitals:  Vitals:   05/03/16 1241 05/03/16 1449  BP: 140/80 (P) 126/87  Pulse: 65 73  Resp: 16 16  Temp: 36.7 C (P) 36.8 C    Last Pain:  Vitals:   05/03/16 1241  TempSrc: Oral      Patients Stated Pain Goal: 4 ([id number])  Complications: No apparent anesthesia complications

## 2016-05-03 NOTE — H&P (Signed)
History of Present Illness The patient is a 50 year old female who presents with a complaint of primary hyperparathyroidism. Patient is a 50 year old female who is referred by Dr. Dorris Fetch for evaluation of primary hyperparathyroidism. Patient appeared to have a fairly lengthy workup in [place]. Patient was referred for surgical management however was denied by her insurance. Patient set multiple calcium levels in the 11 range. Patient's abdomen elevated PTH of 149. Patient does state that she has some mood swings. She has had no issues with kidney stones. Patient does noticed that she's had some palpitations. She was worked up by cardiology however this was normal and felt to be secondary to her hypercalcemia.  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 She is currently on Synthroid. She been doing well from that standpoint.   Allergies No Known Drug Allergies 03/28/2016  Medication History Lisinopril (5MG  Tablet, Oral) Active. Metoprolol Tartrate (25MG  Tablet, Oral) Active. Ferrous Sulfate (27MG  Tablet, Oral) Active. Levothyroxine Sodium (175MCG Tablet, Oral) Active. Vitamin D (Cholecalciferol) (1000UNIT Capsule, Oral) Active. Furosemide (20MG  Tablet, Oral) Active. Medications Reconciled  BP 140/80 (BP Location: Right Arm)   Pulse 65   Temp 98 F (36.7 C) (Oral)   Resp 16   Ht 5\' 3"  (1.6 m)   Wt 127 kg (280 lb)   LMP 02/24/2016   SpO2 99%   BMI 49.60 kg/m    Physical Exam  General Mental Status-Alert. General Appearance-Consistent with stated age. Hydration-Well hydrated. Voice-Normal.  Head and Neck Note: No palpable mass/nodule  Chest and Lung Exam Chest and lung exam reveals -quiet, even and easy respiratory effort with no use of accessory muscles and on auscultation, normal breath sounds, no adventitious sounds and normal vocal resonance. Inspection Chest Wall - Normal. Back -  normal.  Cardiovascular Cardiovascular examination reveals -normal heart sounds, regular rate and rhythm with no murmurs and normal pedal pulses bilaterally.  Abdomen Inspection Inspection of the abdomen reveals - No Hernias. Skin - Scar - no surgical scars. Palpation/Percussion Palpation and Percussion of the abdomen reveal - Soft, Non Tender, No Rebound tenderness, No Rigidity (guarding) and No hepatosplenomegaly. Auscultation Auscultation of the abdomen reveals - Bowel sounds normal.    Assessment & Plan  PRIMARY HYPERPARATHYROIDISM (E21.0) Impression: 50 year old female with primary hyperparathyroidism.  1. At this time the plan is to proceed with minimally invasive parathyroidectomy. 2. Discussed the patient the risks and benefits of the procedure to include but not limited to: Infection, bleeding, damage to structures, possible need for further surgery, and the patient was understanding and wished to proceed.

## 2016-05-03 NOTE — Anesthesia Procedure Notes (Signed)
Procedure Name: Intubation Date/Time: 05/03/2016 1:36 PM Performed by: Lajuana Carry E Pre-anesthesia Checklist: Patient identified, Emergency Drugs available, Suction available and Patient being monitored Patient Re-evaluated:Patient Re-evaluated prior to inductionOxygen Delivery Method: Circle system utilized Preoxygenation: Pre-oxygenation with 100% oxygen Intubation Type: IV induction Ventilation: Mask ventilation without difficulty Laryngoscope Size: Mac and 3 Grade View: Grade I Tube type: Oral Tube size: 7.0 mm Number of attempts: 2 Airway Equipment and Method: Stylet Placement Confirmation: ETT inserted through vocal cords under direct vision,  positive ETCO2 and breath sounds checked- equal and bilateral Secured at: 22 cm Tube secured with: Tape Dental Injury: Teeth and Oropharynx as per pre-operative assessment

## 2016-05-03 NOTE — Anesthesia Postprocedure Evaluation (Signed)
Anesthesia Post Note  Patient: Kim Bryant  Procedure(s) Performed: Procedure(s) (LRB): PARATHYROIDECTOMY RIGHT INFERIOR MINIMALLY INVASIVE (Right)  Patient location during evaluation: PACU Anesthesia Type: General Level of consciousness: awake and alert Pain management: pain level controlled Vital Signs Assessment: post-procedure vital signs reviewed and stable Respiratory status: spontaneous breathing, nonlabored ventilation, respiratory function stable and patient connected to nasal cannula oxygen Cardiovascular status: blood pressure returned to baseline and stable Postop Assessment: no signs of nausea or vomiting Anesthetic complications: no    Last Vitals:  Vitals:   05/03/16 1600 05/03/16 1705  BP: 116/77 113/72  Pulse: (!) 55 70  Resp: 16 16  Temp: 36.6 C 36.4 C    Last Pain:  Vitals:   05/03/16 1705  TempSrc: Oral  PainSc:                  [name],JAMES TERRILL

## 2016-05-03 NOTE — Anesthesia Preprocedure Evaluation (Signed)
Anesthesia Evaluation  Patient identified by MRN, date of birth, ID band Patient awake    Reviewed: Allergy & Precautions, NPO status , Patient's Chart, lab work & pertinent test results  History of Anesthesia Complications Negative for: history of anesthetic complications  Airway Mallampati: III  TM Distance: >3 FB Neck ROM: Full    Dental no notable dental hx. (+) Dental Advisory Given   Pulmonary sleep apnea ,    Pulmonary exam normal breath sounds clear to auscultation       Cardiovascular hypertension, Normal cardiovascular exam Rhythm:Regular Rate:Normal     Neuro/Psych negative neurological ROS  negative psych ROS   GI/Hepatic negative GI ROS, Neg liver ROS,   Endo/Other  Hypothyroidism Morbid obesity  Renal/GU negative Renal ROS  negative genitourinary   Musculoskeletal negative musculoskeletal ROS (+)   Abdominal   Peds negative pediatric ROS (+)  Hematology negative hematology ROS (+)   Anesthesia Other Findings   Reproductive/Obstetrics negative OB ROS                             Anesthesia Physical Anesthesia Plan  ASA: III  Anesthesia Plan: General   Post-op Pain Management:    Induction: Intravenous  Airway Management Planned: Oral ETT  Additional Equipment:   Intra-op Plan:   Post-operative Plan: Extubation in OR  Informed Consent: I have reviewed the patients History and Physical, chart, labs and discussed the procedure including the risks, benefits and alternatives for the proposed anesthesia with the patient or authorized representative who has indicated his/her understanding and acceptance.   Dental advisory given  Plan Discussed with: CRNA  Anesthesia Plan Comments:         Anesthesia Quick Evaluation

## 2016-05-04 DIAGNOSIS — E210 Primary hyperparathyroidism: Secondary | ICD-10-CM | POA: Diagnosis not present

## 2016-05-04 NOTE — Progress Notes (Signed)
Asked patient if she wished to wear CPAP tonight. Patient stated she had spoke with the MD and that he said she did not have to wear it. RT made patient aware that if she felt she needed the CPAP to call anytime and we would set one up.

## 2016-05-04 NOTE — Discharge Summary (Signed)
  Patient ID: [name]:[id number] 50 y.o. 02-19-1966  05/03/2016  Discharge date and time: No discharge date for patient encounter.  Admitting Physician: Dr Rosendo Gros  Discharge Physician: Rosario Adie  Admission Diagnoses: 1 DEGREE HYPERPARATHYROIDISM  Discharge Diagnoses: same  Operations: Procedure(s): PARATHYROIDECTOMY RIGHT INFERIOR MINIMALLY INVASIVE    Discharged Condition: good    Hospital Course: Patient admitted overnight.  AM Ca was 10.1.  Neck was supple.  Voice normal.  Consults: None  Significant Diagnostic Studies: labs: ca  Treatments: IV hydration, analgesia: acetaminophen w/ codeine and surgery: parathyroidectomy  Disposition: Home

## 2016-05-04 NOTE — Discharge Instructions (Signed)
Parathyroidectomy, Care After Refer to this sheet in the next few weeks. These instructions provide you with information on caring for yourself after your procedure. Your health care provider may also give you more specific instructions. Your treatment has been planned according to current medical practices, but problems sometimes occur. Call your health care provider if you have any problems or questions after your procedure. WHAT TO EXPECT AFTER THE PROCEDURE After your procedure, it is typical to have the following:  Tingling or numbness around your mouth or in your fingers or toes.  Temporary hoarseness. HOME CARE INSTRUCTIONS  Take medicines only as directed by your health care provider.  There are many different ways to close and cover an incision, including stitches, skin glue, and adhesive strips. Follow your health care provider's instructions on:  Incision care.  Bandage (dressing) changes and removal.  Incision closure removal.  Do not take baths, swim, or use a hot tub until your health care provider approves.  Follow your health care provider's instructions for eating and drinking. You may need to consume only liquids and soft foods for a day after the procedure.  Rest for the first few days as you heal from the surgery. It may take that long before you can resume all of your usual activities.  Keep all follow-up visits as directed by your health care provider. This is important. Your health care provider needs to monitor the calcium level in your blood to make sure that it does not become low. SEEK MEDICAL CARE IF:  You have tingling or numbness in your lips, toes, or fingers that does not go away within a few days.  You have drainage, redness, swelling, or pain at your incision site.  You have trouble talking.  You have nausea or vomiting for more than 2 days.  You have a fever. SEEK IMMEDIATE MEDICAL CARE IF: You have trouble breathing.    This information is  not intended to replace advice given to you by your health care provider. Make sure you discuss any questions you have with your health care provider.   Document Released: 03/23/2014 Document Reviewed: 03/23/2014 Elsevier Interactive Patient Education [employer].

## 2016-11-05 ENCOUNTER — Other Ambulatory Visit: Payer: Self-pay | Admitting: Cardiovascular Disease

## 2018-02-04 IMAGING — NM NM PARATHYROID W/ SPECT
3 series · 8 of 8 positions shown · non-contrast
Comparison: None

CLINICAL DATA: Increased blood pressure. Dizziness, heart
palpitations an elevated calcium levels.

EXAM:
NM PARATHYROID SCINTIGRAPHY AND SPECT IMAGING
TECHNIQUE: Following intravenous administration of radiopharmaceutical, early
and 2-hour delayed planar images were obtained in the anterior
projection. Delayed triplanar SPECT images were also obtained at 2
hours.
RADIOPHARMACEUTICALS:  26.0 MCi 2c-LLm Sestamibi IV

[Series 1: 15 min ant · 4.14mm/px · 1 of 1 slices shown]
[im 1/1]
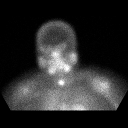

[Series 2: 2 hr ant · 4.14mm/px · 1 of 1 slices shown]
[im 1/1]
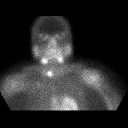

[Series 3: spect parathyroid · 4.14mm/px · 6 of 64 frames shown]
[frame 6/64  full-range]
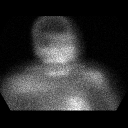
[frame 16/64  full-range]
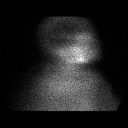
[frame 27/64  full-range]
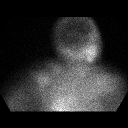
[frame 38/64  full-range]
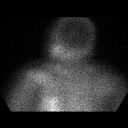
[frame 48/64  full-range]
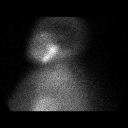
[frame 59/64  full-range]
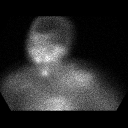

[8 of 8 positions shown; findings below may reference images not displayed]

FINDINGS: On the early images there is a medium size focus of intense
radiotracer uptake localizing to the area around the lower neck. On
the 2 hour delayed images there is a persistent focus of and tense
radiotracer uptake localizing to the lower neck, slightly right of
the midline. No activity within the thyroid gland is identified.
IMPRESSION: 1. There is a focus of intense radiotracer uptake localizing to the
area within the right lower neck slightly towards the right of the
midline. In the appropriate clinical setting findings are compatible
with parathyroid adenoma.
2. Absence of radiotracer uptake within the thyroid gland compatible
with the clinical history of radioactive iodine induced
hypothyroidism.
